# Patient Record
Sex: Female | Born: 1955
Health system: Southern US, Community
[De-identification: ages and names within clinical notes are randomized; demographics above are authoritative.]

## PROBLEM LIST (undated history)

## (undated) DIAGNOSIS — I10 Essential (primary) hypertension: Secondary | ICD-10-CM

## (undated) HISTORY — PX: KNEE ARTHROPLASTY: SHX992

## (undated) HISTORY — DX: Essential (primary) hypertension: I10

## (undated) HISTORY — PX: TUBAL LIGATION: SHX77

---

## 1997-12-02 ENCOUNTER — Other Ambulatory Visit: Admission: RE | Admit: 1997-12-02 | Discharge: 1997-12-02 | Payer: Self-pay | Admitting: Obstetrics and Gynecology

## 1999-08-24 ENCOUNTER — Other Ambulatory Visit: Admission: RE | Admit: 1999-08-24 | Discharge: 1999-08-24 | Payer: Self-pay | Admitting: Obstetrics and Gynecology

## 2001-03-27 ENCOUNTER — Other Ambulatory Visit: Admission: RE | Admit: 2001-03-27 | Discharge: 2001-03-27 | Payer: Self-pay | Admitting: Obstetrics and Gynecology

## 2001-09-06 ENCOUNTER — Encounter: Payer: Self-pay | Admitting: Internal Medicine

## 2001-09-06 ENCOUNTER — Encounter: Admission: RE | Admit: 2001-09-06 | Discharge: 2001-09-06 | Payer: Self-pay | Admitting: Internal Medicine

## 2001-10-17 ENCOUNTER — Encounter: Payer: Self-pay | Admitting: Internal Medicine

## 2001-10-17 ENCOUNTER — Ambulatory Visit (HOSPITAL_COMMUNITY): Admission: RE | Admit: 2001-10-17 | Discharge: 2001-10-17 | Payer: Self-pay | Admitting: Internal Medicine

## 2001-10-23 ENCOUNTER — Encounter: Payer: Self-pay | Admitting: Internal Medicine

## 2001-10-23 ENCOUNTER — Ambulatory Visit (HOSPITAL_COMMUNITY): Admission: RE | Admit: 2001-10-23 | Discharge: 2001-10-23 | Payer: Self-pay | Admitting: Internal Medicine

## 2002-12-05 ENCOUNTER — Ambulatory Visit (HOSPITAL_COMMUNITY): Admission: RE | Admit: 2002-12-05 | Discharge: 2002-12-05 | Payer: Self-pay | Admitting: Internal Medicine

## 2004-07-28 ENCOUNTER — Ambulatory Visit (HOSPITAL_COMMUNITY): Admission: RE | Admit: 2004-07-28 | Discharge: 2004-07-28 | Payer: Self-pay | Admitting: Obstetrics and Gynecology

## 2006-02-08 ENCOUNTER — Ambulatory Visit (HOSPITAL_COMMUNITY): Admission: RE | Admit: 2006-02-08 | Discharge: 2006-02-08 | Payer: Self-pay | Admitting: Gastroenterology

## 2006-04-21 ENCOUNTER — Ambulatory Visit (HOSPITAL_COMMUNITY): Admission: RE | Admit: 2006-04-21 | Discharge: 2006-04-21 | Payer: Self-pay | Admitting: Internal Medicine

## 2007-06-15 ENCOUNTER — Ambulatory Visit (HOSPITAL_COMMUNITY): Admission: RE | Admit: 2007-06-15 | Discharge: 2007-06-15 | Payer: Self-pay | Admitting: Internal Medicine

## 2009-01-20 ENCOUNTER — Ambulatory Visit (HOSPITAL_COMMUNITY): Admission: RE | Admit: 2009-01-20 | Discharge: 2009-01-20 | Payer: Self-pay | Admitting: Internal Medicine

## 2010-02-02 ENCOUNTER — Ambulatory Visit (HOSPITAL_COMMUNITY)
Admission: RE | Admit: 2010-02-02 | Discharge: 2010-02-02 | Payer: Self-pay | Source: Home / Self Care | Attending: Internal Medicine | Admitting: Internal Medicine

## 2010-02-21 ENCOUNTER — Encounter: Payer: Self-pay | Admitting: Internal Medicine

## 2011-01-28 ENCOUNTER — Other Ambulatory Visit (HOSPITAL_COMMUNITY): Payer: Self-pay | Admitting: Internal Medicine

## 2011-01-28 DIAGNOSIS — Z1231 Encounter for screening mammogram for malignant neoplasm of breast: Secondary | ICD-10-CM

## 2011-02-28 ENCOUNTER — Ambulatory Visit (HOSPITAL_COMMUNITY)
Admission: RE | Admit: 2011-02-28 | Discharge: 2011-02-28 | Disposition: A | Discharge status: Home / Self Care | Payer: BC Managed Care – PPO | Source: Ambulatory Visit | Attending: Internal Medicine | Admitting: Internal Medicine

## 2011-02-28 DIAGNOSIS — Z1231 Encounter for screening mammogram for malignant neoplasm of breast: Secondary | ICD-10-CM | POA: Insufficient documentation

## 2012-02-24 ENCOUNTER — Other Ambulatory Visit (HOSPITAL_COMMUNITY): Payer: Self-pay | Admitting: Internal Medicine

## 2012-02-24 DIAGNOSIS — Z1231 Encounter for screening mammogram for malignant neoplasm of breast: Secondary | ICD-10-CM

## 2012-02-29 ENCOUNTER — Ambulatory Visit (HOSPITAL_COMMUNITY)
Admission: RE | Admit: 2012-02-29 | Discharge: 2012-02-29 | Disposition: A | Discharge status: Home / Self Care | Payer: BC Managed Care – PPO | Source: Ambulatory Visit | Attending: Internal Medicine | Admitting: Internal Medicine

## 2012-02-29 DIAGNOSIS — Z1231 Encounter for screening mammogram for malignant neoplasm of breast: Secondary | ICD-10-CM

## 2012-05-16 ENCOUNTER — Other Ambulatory Visit: Payer: Self-pay | Admitting: Internal Medicine

## 2012-05-16 ENCOUNTER — Ambulatory Visit
Admission: RE | Admit: 2012-05-16 | Discharge: 2012-05-16 | Disposition: A | Discharge status: Home / Self Care | Payer: Managed Care, Other (non HMO) | Source: Ambulatory Visit | Attending: Internal Medicine | Admitting: Internal Medicine

## 2012-05-16 DIAGNOSIS — R05 Cough: Secondary | ICD-10-CM

## 2012-05-16 DIAGNOSIS — R053 Chronic cough: Secondary | ICD-10-CM

## 2012-06-26 ENCOUNTER — Other Ambulatory Visit (HOSPITAL_COMMUNITY): Payer: Self-pay | Admitting: Internal Medicine

## 2012-06-26 DIAGNOSIS — Z1231 Encounter for screening mammogram for malignant neoplasm of breast: Secondary | ICD-10-CM

## 2013-03-01 ENCOUNTER — Ambulatory Visit (HOSPITAL_COMMUNITY)
Admission: RE | Admit: 2013-03-01 | Discharge: 2013-03-01 | Disposition: A | Discharge status: Home / Self Care | Payer: BC Managed Care – PPO | Source: Ambulatory Visit | Attending: Internal Medicine | Admitting: Internal Medicine

## 2013-03-01 DIAGNOSIS — Z1231 Encounter for screening mammogram for malignant neoplasm of breast: Secondary | ICD-10-CM | POA: Insufficient documentation

## 2014-02-05 ENCOUNTER — Other Ambulatory Visit (HOSPITAL_COMMUNITY): Payer: Self-pay | Admitting: Internal Medicine

## 2014-02-05 DIAGNOSIS — Z1231 Encounter for screening mammogram for malignant neoplasm of breast: Secondary | ICD-10-CM

## 2014-03-03 ENCOUNTER — Ambulatory Visit (HOSPITAL_COMMUNITY)
Admission: RE | Admit: 2014-03-03 | Discharge: 2014-03-03 | Disposition: A | Discharge status: Home / Self Care | Payer: BC Managed Care – PPO | Source: Ambulatory Visit | Attending: Internal Medicine | Admitting: Internal Medicine

## 2014-03-03 DIAGNOSIS — Z1231 Encounter for screening mammogram for malignant neoplasm of breast: Secondary | ICD-10-CM | POA: Diagnosis present

## 2015-01-27 ENCOUNTER — Other Ambulatory Visit: Payer: Self-pay

## 2015-01-27 DIAGNOSIS — Z1231 Encounter for screening mammogram for malignant neoplasm of breast: Secondary | ICD-10-CM

## 2015-03-05 ENCOUNTER — Ambulatory Visit
Admission: RE | Admit: 2015-03-05 | Discharge: 2015-03-05 | Disposition: A | Discharge status: Home / Self Care | Payer: BC Managed Care – PPO | Source: Ambulatory Visit

## 2015-03-05 DIAGNOSIS — Z1231 Encounter for screening mammogram for malignant neoplasm of breast: Secondary | ICD-10-CM

## 2016-04-12 ENCOUNTER — Other Ambulatory Visit: Payer: Self-pay | Admitting: Internal Medicine

## 2016-04-12 DIAGNOSIS — Z1231 Encounter for screening mammogram for malignant neoplasm of breast: Secondary | ICD-10-CM

## 2016-04-29 ENCOUNTER — Ambulatory Visit: Payer: BC Managed Care – PPO

## 2016-05-02 ENCOUNTER — Ambulatory Visit
Admission: RE | Admit: 2016-05-02 | Discharge: 2016-05-02 | Disposition: A | Discharge status: Home / Self Care | Payer: BC Managed Care – PPO | Source: Ambulatory Visit | Attending: Internal Medicine | Admitting: Internal Medicine

## 2016-05-02 DIAGNOSIS — Z1231 Encounter for screening mammogram for malignant neoplasm of breast: Secondary | ICD-10-CM

## 2016-11-15 ENCOUNTER — Other Ambulatory Visit: Payer: Self-pay | Admitting: Internal Medicine

## 2016-11-15 DIAGNOSIS — E2839 Other primary ovarian failure: Secondary | ICD-10-CM

## 2017-04-11 ENCOUNTER — Other Ambulatory Visit: Payer: Self-pay | Admitting: Internal Medicine

## 2017-04-11 DIAGNOSIS — Z1231 Encounter for screening mammogram for malignant neoplasm of breast: Secondary | ICD-10-CM

## 2017-05-09 ENCOUNTER — Ambulatory Visit
Admission: RE | Admit: 2017-05-09 | Discharge: 2017-05-09 | Disposition: A | Discharge status: Home / Self Care | Payer: BC Managed Care – PPO | Source: Ambulatory Visit | Attending: Internal Medicine | Admitting: Internal Medicine

## 2017-05-09 DIAGNOSIS — Z1231 Encounter for screening mammogram for malignant neoplasm of breast: Secondary | ICD-10-CM

## 2017-05-09 DIAGNOSIS — E2839 Other primary ovarian failure: Secondary | ICD-10-CM

## 2018-04-11 ENCOUNTER — Other Ambulatory Visit: Payer: Self-pay | Admitting: Family Medicine

## 2018-04-11 DIAGNOSIS — Z1231 Encounter for screening mammogram for malignant neoplasm of breast: Secondary | ICD-10-CM

## 2018-05-15 ENCOUNTER — Ambulatory Visit: Payer: BC Managed Care – PPO

## 2018-07-03 ENCOUNTER — Other Ambulatory Visit: Payer: Self-pay | Admitting: Family Medicine

## 2018-07-03 ENCOUNTER — Other Ambulatory Visit: Payer: Self-pay

## 2018-07-03 ENCOUNTER — Ambulatory Visit
Admission: RE | Admit: 2018-07-03 | Discharge: 2018-07-03 | Disposition: A | Discharge status: Home / Self Care | Payer: BC Managed Care – PPO | Source: Ambulatory Visit | Attending: Family Medicine | Admitting: Family Medicine

## 2018-07-03 DIAGNOSIS — Z1231 Encounter for screening mammogram for malignant neoplasm of breast: Secondary | ICD-10-CM

## 2018-08-08 ENCOUNTER — Other Ambulatory Visit: Payer: Self-pay

## 2018-08-08 ENCOUNTER — Ambulatory Visit: Payer: BC Managed Care – PPO | Admitting: Internal Medicine

## 2018-08-08 ENCOUNTER — Encounter: Payer: Self-pay | Admitting: Internal Medicine

## 2018-08-08 ENCOUNTER — Ambulatory Visit (INDEPENDENT_AMBULATORY_CARE_PROVIDER_SITE_OTHER): Payer: BC Managed Care – PPO

## 2018-08-08 DIAGNOSIS — R0789 Other chest pain: Secondary | ICD-10-CM

## 2018-08-08 NOTE — Progress Notes (Signed)
Amber Farmer, female    DOB: Jan 28, 1956,  MRN: [id number]   Brief patient profile:  63 yobf never smoker with onset of recurrent cp x in her 20's while living in [place] with full term  IUP's with last one age 63-64 tol fine (says did not notice when pregnant)  and  eval by [place] around 2010 neg but more noticeable since 2018 so referred to pulmonary clinic 08/08/2018 by Dr   Moreen Fowler      History of Present Illness  08/08/2018  Pulmonary/ 1st office eval/[facility]  Chief Complaint  Patient presents with  . Pulmonary Consult    Referred by Dr Antony Contras. Pt c/o CP x 40 yrs.   Dyspnea:  Walks daily, some hill/steps no trouble with house work never brings on chest pain Cough: none  Sleep: fine now sleeping  on back and does better and never wakes her now, never has   SABA use: none   Present daily, located just to the Left of lower sternum  last from one second to sev minutes never an hour, no relation to meals, belching  always in sitting position  - assoc with some sensation of abd bloating and has changed diet to more salads in recent years.   No obvious other patterns  day to day or daytime variability or assoc excess/ purulent sputum or mucus plugs or hemoptysis or  chest tightness, subjective wheeze or overt sinus or hb symptoms.   Sleeping  without nocturnal  or early am exacerbation  of respiratory  c/o's or need for noct saba. Also denies any obvious fluctuation of symptoms with weather or environmental changes or other aggravating or alleviating factors except as outlined above   No unusual exposure hx or h/o childhood pna/ asthma or knowledge of premature birth.  Current Allergies, Complete Past Medical History, Past Surgical History, Family History, and Social History were reviewed in [employer] record.  ROS  The following are not active complaints unless bolded Hoarseness, sore throat, dysphagia, dental problems, itching, sneezing,  nasal  congestion or discharge of excess mucus or purulent secretions, ear ache,   fever, chills, sweats, unintended wt loss or wt gain, classically pleuritic or exertional cp,  orthopnea pnd or arm/hand swelling  or leg swelling, presyncope, palpitations, abdominal pain, anorexia, nausea, vomiting, diarrhea  or change in bowel habits or change in bladder habits, change in stools or change in urine, dysuria, hematuria,  rash, arthralgias, visual complaints, headache, numbness, weakness or ataxia or problems with walking or coordination,  change in mood or  memory.             No past medical history on file.  Outpatient Medications Prior to Visit  Medication Sig Dispense Refill  . aspirin EC 81 MG tablet Take 81 mg by mouth daily.    . Calcium 200 MG TABS Take by mouth.    . Multiple Vitamin (MULTIVITAMIN) capsule Take 1 capsule by mouth daily.    . valsartan-hydrochlorothiazide (DIOVAN-HCT) 160-12.5 MG tablet Take 1 tablet by mouth daily.    Marland Kitchen VITAMIN D PO Take 1 tablet by mouth daily.        Objective:     BP 136/80 (BP Location: Left Arm, Cuff Size: Normal)   Pulse 78   Temp 98.4 F (36.9 C) (Oral)   Ht 5\' 5"  (1.651 m)   Wt 171 lb 12.8 oz (77.9 kg)   SpO2 98% Comment: on RA  BMI 28.59 kg/m   SpO2:  98 %(on RA)   amb bf nad  HEENT: nl dentition, turbinates bilaterally, and oropharynx. Nl external ear canals without cough reflex   NECK :  without JVD/Nodes/TM/ nl carotid upstrokes bilaterally   LUNGS: no acc muscle use,  Nl contour chest which is clear to A and P bilaterally without cough on insp or exp maneuvers   CV:  RRR  no s3 or murmur or increase in P2, and no edema   ABD:  soft and nontender with nl inspiratory excursion in the supine position. No bruits or organomegaly appreciated, bowel sounds nl  MS:  Nl gait/ ext warm without deformities, calf tenderness, cyanosis or clubbing No obvious joint restrictions   SKIN: warm and dry without lesions    NEURO:  alert,  approp, nl sensorium with  no motor or cerebellar deficits apparent.   CXR PA and Lateral:   08/08/2018 :    I personally reviewed images and agree with radiology impression as follows:    No active cardiopulmonary disease.     Assessment   No problem-specific Assessment & Plan notes found for this encounter.     Sandrea HughsMichael Wert, MD 08/08/2018

## 2018-08-08 NOTE — Patient Instructions (Addendum)
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 Frequently these patients have had multiple negative GI workups and CT scans.  Treatment consists of avoiding foods that cause gas (especially boiled eggs, mexcican food but especially  beans and undercooked vegetables like  spinach and some salads)  and citrucel 1 heaping tsp twice daily with a large glass of water.  Pain should improve w/in 2 weeks and if not then consider further GI work up.     Please remember to go to the  x-ray department  for your tests - we will call you with the results when they are available     Call if not better after at least two weeks of the above for referral to GI doctor

## 2018-08-09 ENCOUNTER — Telehealth: Payer: Self-pay | Admitting: Internal Medicine

## 2018-08-09 ENCOUNTER — Encounter: Payer: Self-pay | Admitting: Internal Medicine

## 2018-08-09 NOTE — Telephone Encounter (Signed)
Notes recorded by Tanda Rockers, MD on 08/08/2018 at 4:44 PM EDT  Call pt: Reviewed cxr and no acute change so no change in recommendations made at ov  ----------------------------------- Spoke with pt. She is aware of results. Nothing further was needed.

## 2018-08-09 NOTE — Assessment & Plan Note (Signed)
Onset in her 20's, strictly sitting, never supine or with exertion  - [place] w/u neg around 2010  - IBS rx rec  08/08/2018   Classic  Atypical chest pain pattern suggests ibs:  Stereotypical,   with a very limited distribution of pain locations, daytime, not usually exacerbated by exercise  or coughing, worse in sitting position, frequently associated with generalized abd bloating, not as likely to be present supine due to the dome effect of the diaphragm which  is  canceled in that position. Frequently these patients have had multiple negative GI workups and CT scans.  Treatment consists of avoiding foods that cause gas (especially boiled eggs, mexcican food but especially  beans and undercooked vegetables like  spinach and some salads)  and citrucel 1 heaping tsp twice daily with a large glass of water.  Pain should improve w/in 2 weeks and if not then consider further GI work up.      Discussed in detail all the  indications, usual  risks and alternatives  relative to the benefits with patient who agrees to proceed with w/u as outlined.      Total time devoted to counseling  > 50 % of initial 60 min office visit:  review case with pt/ discussion of options/alternatives/ personally creating written customized instructions  in presence of pt  then going over those specific  Instructions directly with the pt including how to use all of the meds but in particular covering each new medication in detail and the difference between the maintenance= "automatic" meds and the prns using an action plan format for the latter (If this problem/symptom => do that organization reading Left to right).  Please see AVS from this visit for a full list of these instructions which I personally wrote for this pt and  are unique to this visit.

## 2019-02-13 ENCOUNTER — Ambulatory Visit: Payer: BC Managed Care – PPO | Attending: Internal Medicine

## 2019-02-13 ENCOUNTER — Telehealth: Payer: Self-pay | Admitting: Internal Medicine

## 2019-02-13 DIAGNOSIS — Z20822 Contact with and (suspected) exposure to covid-19: Secondary | ICD-10-CM

## 2019-02-13 NOTE — Telephone Encounter (Signed)
  Nyoka Cowden, MD  08/08/2018 4:44 PM EDT    Call pt: Reviewed cxr and no acute change so no change in recommendations made at [facility]     Advised pt of results. Pt understood and nothing further is needed.

## 2019-02-14 LAB — NOVEL CORONAVIRUS, NAA: SARS-CoV-2, NAA: NOT DETECTED

## 2019-02-15 ENCOUNTER — Ambulatory Visit: Payer: BC Managed Care – PPO | Attending: Internal Medicine

## 2019-02-15 DIAGNOSIS — Z20822 Contact with and (suspected) exposure to covid-19: Secondary | ICD-10-CM

## 2019-02-16 LAB — NOVEL CORONAVIRUS, NAA: SARS-CoV-2, NAA: NOT DETECTED

## 2019-04-06 ENCOUNTER — Ambulatory Visit: Payer: BC Managed Care – PPO | Attending: Internal Medicine

## 2019-04-06 DIAGNOSIS — Z23 Encounter for immunization: Secondary | ICD-10-CM

## 2019-04-06 NOTE — Progress Notes (Signed)
   Covid-19 Vaccination Clinic  Name:  Tyshana Nishida    MRN: [id number] DOB: 08-Aug-1955  04/06/2019  Ms. Longanecker was observed post Covid-19 immunization for 15 minutes without incident. She was provided with Vaccine Information Sheet and instruction to access the V-Safe system.   Ms. Heinsohn was instructed to call 911 with any severe reactions post vaccine: Marland Kitchen Difficulty breathing  . Swelling of face and throat  . A fast heartbeat  . A bad rash all over body  . Dizziness and weakness   Immunizations Administered    Name Date Dose VIS Date Route   Pfizer COVID-19 Vaccine 04/06/2019  9:48 AM 0.3 mL 01/11/2019 Intramuscular   Manufacturer: [employer], [employer]   Lot: [id number]   NDC: [address]-7

## 2019-04-27 ENCOUNTER — Ambulatory Visit: Payer: BC Managed Care – PPO | Attending: Internal Medicine

## 2019-04-27 DIAGNOSIS — Z23 Encounter for immunization: Secondary | ICD-10-CM

## 2019-04-27 NOTE — Progress Notes (Signed)
   Covid-19 Vaccination Clinic  Name:  Amber Farmer    MRN: [id number] DOB: [birth year]  04/27/2019  [name] was observed post Covid-19 immunization for 15 minutes without incident. She was provided with Vaccine Information Sheet and instruction to access the V-Safe system.   [name] was instructed to call 911 with any severe reactions post vaccine: Marland Kitchen Difficulty breathing  . Swelling of face and throat  . A fast heartbeat  . A bad rash all over body  . Dizziness and weakness   Immunizations Administered    Name Date Dose VIS Date Route   Pfizer COVID-19 Vaccine 04/27/2019 11:03 AM 0.3 mL 01/11/2019 Intramuscular   Manufacturer: [employer], [employer]   Lot: [id number]   NDC: [address]-3

## 2020-02-04 ENCOUNTER — Other Ambulatory Visit: Payer: Self-pay

## 2020-02-04 ENCOUNTER — Ambulatory Visit: Payer: Self-pay | Attending: Internal Medicine

## 2020-02-04 DIAGNOSIS — Z23 Encounter for immunization: Secondary | ICD-10-CM

## 2020-02-04 NOTE — Progress Notes (Signed)
   Covid-19 Vaccination Clinic  Name:  Amber Farmer    MRN: [id number] DOB: 1955/09/04  02/04/2020  Amber Farmer was observed post Covid-19 immunization for 15 minutes without incident. She was provided with Vaccine Information Sheet and instruction to access the V-Safe system.   Amber Farmer was instructed to call 911 with any severe reactions post vaccine: Marland Kitchen Difficulty breathing  . Swelling of face and throat  . A fast heartbeat  . A bad rash all over body  . Dizziness and weakness   Immunizations Administered    Name Date Dose VIS Date Route   Pfizer COVID-19 Vaccine 02/04/2020  3:30 PM 0.3 mL 11/20/2019 Intramuscular   Manufacturer: [employer], [employer]   Lot: [id number]   NDC: [address]-1

## 2020-05-20 DIAGNOSIS — H35033 Hypertensive retinopathy, bilateral: Secondary | ICD-10-CM | POA: Diagnosis not present

## 2020-05-26 DIAGNOSIS — I10 Essential (primary) hypertension: Secondary | ICD-10-CM | POA: Diagnosis not present

## 2020-05-26 DIAGNOSIS — E7800 Pure hypercholesterolemia, unspecified: Secondary | ICD-10-CM | POA: Diagnosis not present

## 2020-05-26 DIAGNOSIS — Z23 Encounter for immunization: Secondary | ICD-10-CM | POA: Diagnosis not present

## 2020-05-26 DIAGNOSIS — R7303 Prediabetes: Secondary | ICD-10-CM | POA: Diagnosis not present

## 2020-05-26 DIAGNOSIS — M940 Chondrocostal junction syndrome [Tietze]: Secondary | ICD-10-CM | POA: Diagnosis not present

## 2020-05-26 DIAGNOSIS — R7309 Other abnormal glucose: Secondary | ICD-10-CM | POA: Diagnosis not present

## 2020-08-20 DIAGNOSIS — Z20822 Contact with and (suspected) exposure to covid-19: Secondary | ICD-10-CM | POA: Diagnosis not present

## 2020-08-20 DIAGNOSIS — J069 Acute upper respiratory infection, unspecified: Secondary | ICD-10-CM | POA: Diagnosis not present

## 2020-08-20 DIAGNOSIS — U071 COVID-19: Secondary | ICD-10-CM | POA: Diagnosis not present

## 2020-08-25 DIAGNOSIS — Z20822 Contact with and (suspected) exposure to covid-19: Secondary | ICD-10-CM | POA: Diagnosis not present

## 2020-08-28 DIAGNOSIS — Z20822 Contact with and (suspected) exposure to covid-19: Secondary | ICD-10-CM | POA: Diagnosis not present

## 2020-08-28 DIAGNOSIS — Z03818 Encounter for observation for suspected exposure to other biological agents ruled out: Secondary | ICD-10-CM | POA: Diagnosis not present

## 2020-09-22 DIAGNOSIS — Z1239 Encounter for other screening for malignant neoplasm of breast: Secondary | ICD-10-CM | POA: Diagnosis not present

## 2020-09-22 DIAGNOSIS — Z1211 Encounter for screening for malignant neoplasm of colon: Secondary | ICD-10-CM | POA: Diagnosis not present

## 2020-09-22 DIAGNOSIS — Z1231 Encounter for screening mammogram for malignant neoplasm of breast: Secondary | ICD-10-CM | POA: Diagnosis not present

## 2020-09-22 DIAGNOSIS — Z01419 Encounter for gynecological examination (general) (routine) without abnormal findings: Secondary | ICD-10-CM | POA: Diagnosis not present

## 2020-09-22 DIAGNOSIS — M25511 Pain in right shoulder: Secondary | ICD-10-CM | POA: Diagnosis not present

## 2020-10-13 DIAGNOSIS — M25511 Pain in right shoulder: Secondary | ICD-10-CM | POA: Diagnosis not present

## 2020-10-14 DIAGNOSIS — M25511 Pain in right shoulder: Secondary | ICD-10-CM | POA: Diagnosis not present

## 2020-10-14 DIAGNOSIS — M25512 Pain in left shoulder: Secondary | ICD-10-CM | POA: Diagnosis not present

## 2020-10-14 DIAGNOSIS — M19011 Primary osteoarthritis, right shoulder: Secondary | ICD-10-CM | POA: Diagnosis not present

## 2020-11-23 DIAGNOSIS — M25511 Pain in right shoulder: Secondary | ICD-10-CM | POA: Diagnosis not present

## 2020-11-23 DIAGNOSIS — L600 Ingrowing nail: Secondary | ICD-10-CM | POA: Diagnosis not present

## 2020-11-23 DIAGNOSIS — M940 Chondrocostal junction syndrome [Tietze]: Secondary | ICD-10-CM | POA: Diagnosis not present

## 2020-11-23 DIAGNOSIS — Z1159 Encounter for screening for other viral diseases: Secondary | ICD-10-CM | POA: Diagnosis not present

## 2020-11-23 DIAGNOSIS — Z0000 Encounter for general adult medical examination without abnormal findings: Secondary | ICD-10-CM | POA: Diagnosis not present

## 2020-11-23 DIAGNOSIS — E7800 Pure hypercholesterolemia, unspecified: Secondary | ICD-10-CM | POA: Diagnosis not present

## 2020-11-23 DIAGNOSIS — I10 Essential (primary) hypertension: Secondary | ICD-10-CM | POA: Diagnosis not present

## 2020-11-23 DIAGNOSIS — E2839 Other primary ovarian failure: Secondary | ICD-10-CM | POA: Diagnosis not present

## 2020-11-23 DIAGNOSIS — R7303 Prediabetes: Secondary | ICD-10-CM | POA: Diagnosis not present

## 2020-11-23 DIAGNOSIS — M19011 Primary osteoarthritis, right shoulder: Secondary | ICD-10-CM | POA: Diagnosis not present

## 2020-12-03 ENCOUNTER — Other Ambulatory Visit: Payer: Self-pay

## 2020-12-03 ENCOUNTER — Ambulatory Visit: Payer: Medicare PPO | Admitting: Sports Medicine

## 2020-12-03 DIAGNOSIS — M79675 Pain in left toe(s): Secondary | ICD-10-CM | POA: Diagnosis not present

## 2020-12-03 DIAGNOSIS — M79674 Pain in right toe(s): Secondary | ICD-10-CM | POA: Diagnosis not present

## 2020-12-03 DIAGNOSIS — M79672 Pain in left foot: Secondary | ICD-10-CM

## 2020-12-03 DIAGNOSIS — M79676 Pain in unspecified toe(s): Secondary | ICD-10-CM | POA: Diagnosis not present

## 2020-12-03 MED ORDER — NEOMYCIN-POLYMYXIN-HC 3.5-10000-1 OT SOLN: OTIC | 0 refills | Status: AC

## 2020-12-03 NOTE — Progress Notes (Signed)
Subjective: Amber Farmer is a 65 y.o. female patient who presents to office for evaluation of 1.  Nails that tend to grow into the skin on both big toes states that currently there is no soreness only occasional soreness most recently on the left great toenail.  Patient also reports that she tends to have to dig out the nails and has been doing this for very long time but has been afraid since COVID to go back for pedicures.  Patient also reports that she has some discoloration at the bottom of the left third toe thinks that is from her putting on a toe ring and leaving it on too long.  Patient also states for the last 6 months to a year has had an annoying pain when she is trying to sleep or lay down at the back of her left heel states that she cannot put pressure directly to the back of the heel because of the ongoing discomfort states that it does not really hurt but is annoying and she has to change position in bed in order to get relief.  Patient denies any other pedal complaints at this time.  Patient Active Problem List   Diagnosis Date Noted   Atypical chest pain 08/08/2018    Current Outpatient Medications on File Prior to Visit  Medication Sig Dispense Refill   aspirin EC 81 MG tablet Take 81 mg by mouth daily.     Calcium 200 MG TABS Take by mouth.     Multiple Vitamin (MULTIVITAMIN) capsule Take 1 capsule by mouth daily.     valsartan-hydrochlorothiazide (DIOVAN-HCT) 160-12.5 MG tablet Take 1 tablet by mouth daily.     VITAMIN D PO Take 1 tablet by mouth daily.     No current facility-administered medications on file prior to visit.    Allergies  Allergen Reactions   Codeine Camsylate [Codeine] Itching    Objective:  General: Alert and oriented x3 in no acute distress  Dermatology: No open lesions bilateral lower extremities, no webspace macerations, no ecchymosis bilateral, all nails x 10 are well manicured there is minimal incurvation noted bilateral hallux there is  some significant dry skin at the medial nail fold left hallux greater than right.  There is mild discoloration likely consistent with scar at the plantar aspect of the left third toe with no signs of infection.  Vascular: Dorsalis Pedis and Posterior Tibial pedal pulses palpable, Capillary Fill Time 3 seconds,(+) pedal hair growth bilateral, no edema bilateral lower extremities, Temperature gradient within normal limits.  Neurology: Michaell Cowing sensation intact via light touch bilateral.  Musculoskeletal: Mild tenderness with palpation at Achilles insertion on the posterior heel on the left there is no palpable dell or gap Achilles feels intact and there is only some discomfort with palpation to the Achilles insertion directly central on the left heel.  There is limited ankle joint range of motion on the left.  Assessment and Plan: Problem List Items Addressed This Visit   None Visit Diagnoses     Pain around toenail    -  Primary   Toe pain, bilateral       Pain of left heel       Small annoying discomfort        -Complete examination performed -Discussed treatment options for chronic nail problem with pain around toenails with incurvation at this time there is no acute signs of an ingrown recommend conservative care with patient going for pedicures and using Corticosporin as directed if there  is any pain to the skin around the toes or the nail folds or at areas of ingrowing advised patient if this fails to provide complete relief in the future may benefit from a nail procedure -Discussed treatment options for what I think is a scar from her wearing a toe ring at the plantar aspect of the left third toe.  Advised patient to monitor since there is no open lesions to the area and advised patient to use over-the-counter scar cream or cocoa butter to the area after a bath or shower -Discussed treatment options for annoying pain at the back of the left heel advised patient to offload the heel when  sleeping with a pillow underneath the calf to prevent rubbing or direct pressure to the heel, advised patient gentle stretching as directed throughout her day as well as before and after she goes for walks, advised patient to avoid walks that are very healing that could cause the tendon to tighten up and over pool, advised patient to try over-the-counter topical pain rub or patch and may also try soaking and ice if pain worsens.  Advised patient if pain worsens may return to office for reevaluation and for x-rays -Return in 8 weeks for toe check/nail check or sooner if condition worsens.  Advised patient if conservative option is doing good for her history of pain around the toenails and we will continue with this versus considering a ingrown nail procedure however at this time patient appears to want to continue with conservative care for this issue  Asencion Islam, DPM

## 2021-02-04 ENCOUNTER — Ambulatory Visit: Payer: Medicare PPO | Admitting: Sports Medicine

## 2021-04-20 DIAGNOSIS — Z86010 Personal history of colonic polyps: Secondary | ICD-10-CM | POA: Diagnosis not present

## 2021-04-20 DIAGNOSIS — I10 Essential (primary) hypertension: Secondary | ICD-10-CM | POA: Diagnosis not present

## 2021-04-20 DIAGNOSIS — Z1211 Encounter for screening for malignant neoplasm of colon: Secondary | ICD-10-CM | POA: Diagnosis not present

## 2021-04-20 DIAGNOSIS — K5730 Diverticulosis of large intestine without perforation or abscess without bleeding: Secondary | ICD-10-CM | POA: Diagnosis not present

## 2021-05-27 DIAGNOSIS — F458 Other somatoform disorders: Secondary | ICD-10-CM | POA: Diagnosis not present

## 2021-05-27 DIAGNOSIS — I10 Essential (primary) hypertension: Secondary | ICD-10-CM | POA: Diagnosis not present

## 2021-05-27 DIAGNOSIS — R519 Headache, unspecified: Secondary | ICD-10-CM | POA: Diagnosis not present

## 2021-05-27 DIAGNOSIS — R61 Generalized hyperhidrosis: Secondary | ICD-10-CM | POA: Diagnosis not present

## 2021-05-27 DIAGNOSIS — R0789 Other chest pain: Secondary | ICD-10-CM | POA: Diagnosis not present

## 2021-05-27 IMAGING — MG DIGITAL SCREENING BILATERAL MAMMOGRAM WITH CAD
4 series · 4 of 4 positions shown · non-contrast
Comparison: Previous exam(s).

CLINICAL DATA: Screening.

EXAM:
DIGITAL SCREENING BILATERAL MAMMOGRAM WITH CAD

[L CC]
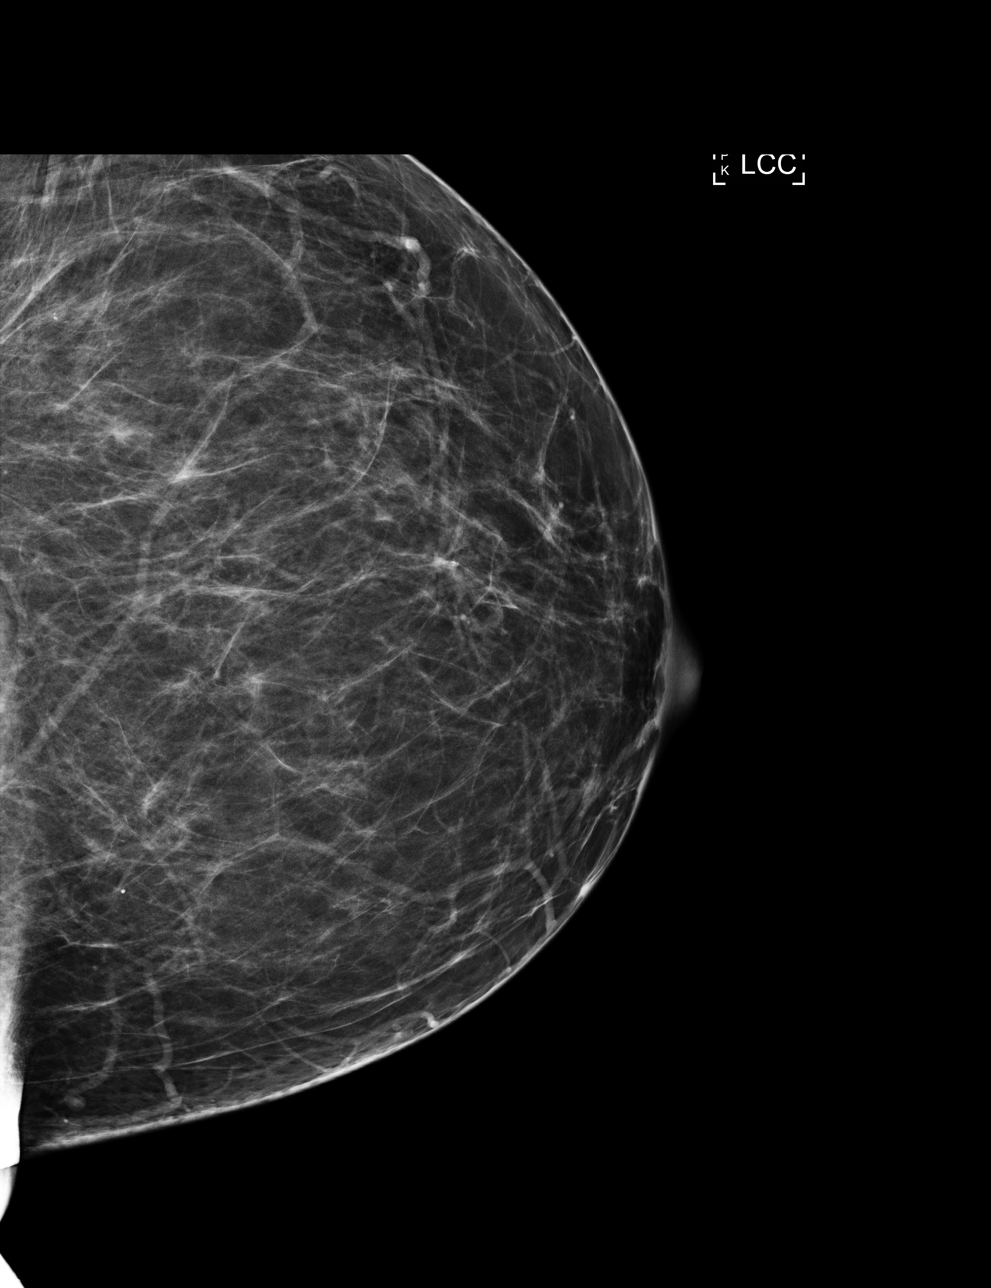

[L MLO]
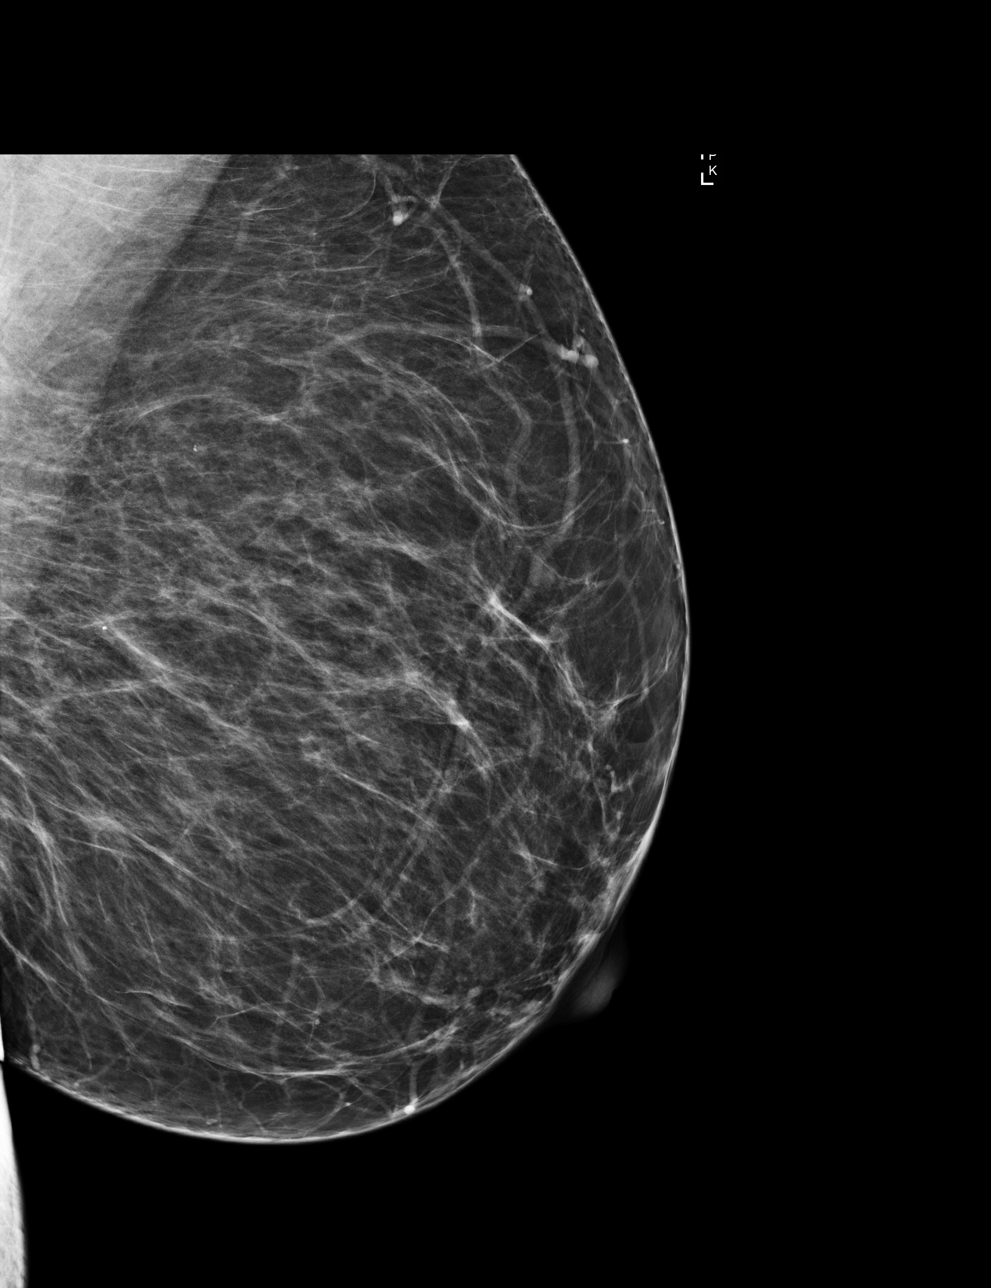

[R MLO]
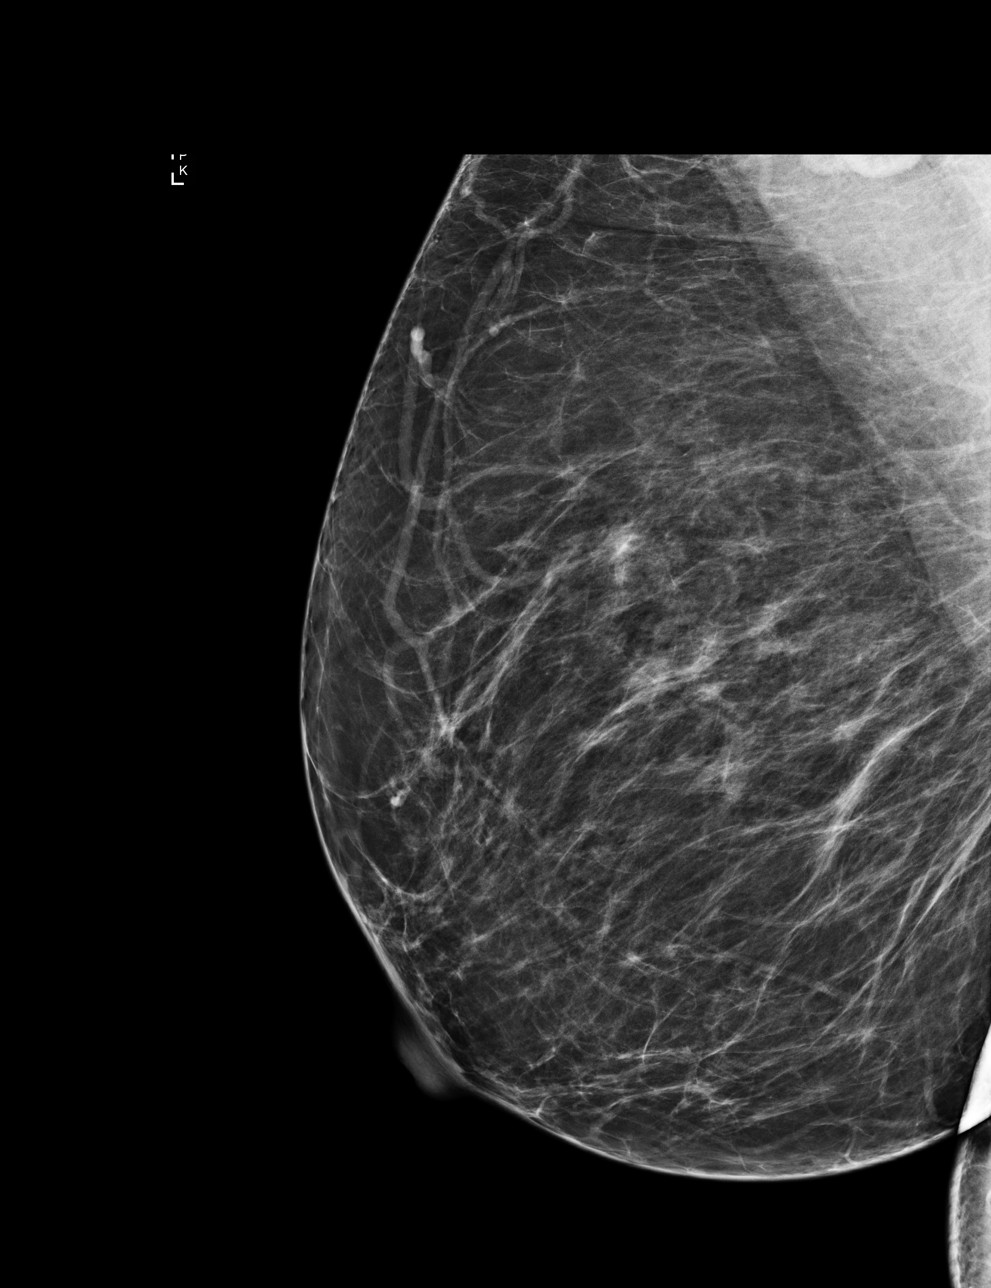

[R CC]
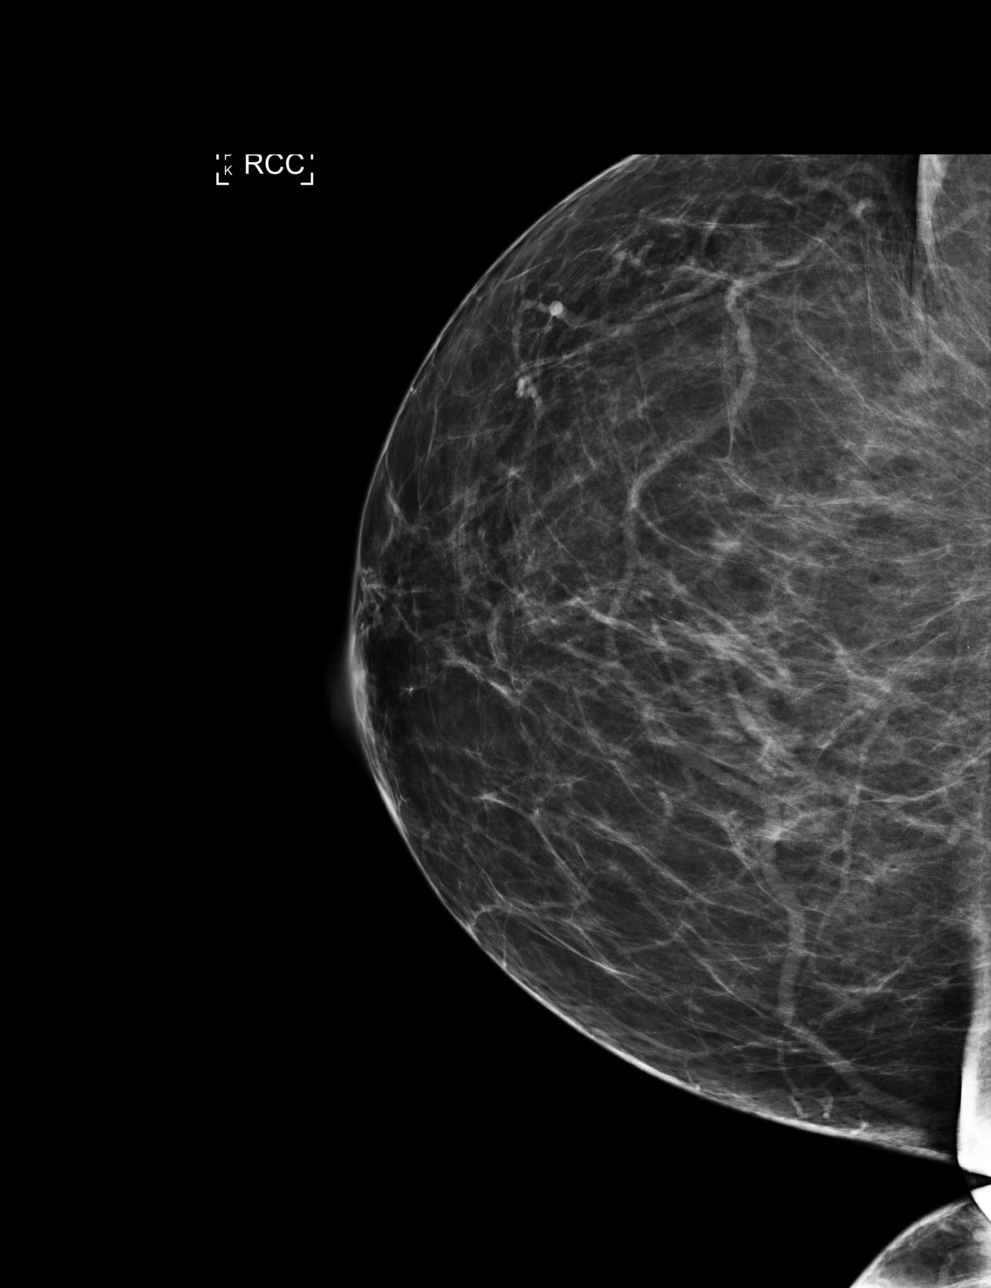

[4 of 4 positions shown; findings below may reference images not displayed]

ACR Breast Density Category b: There are scattered areas of
fibroglandular density.
FINDINGS: There are no findings suspicious for malignancy. Images were
processed with CAD.
IMPRESSION: No mammographic evidence of malignancy. A result letter of this
screening mammogram will be mailed directly to the patient.

RECOMMENDATION:
Screening mammogram in one year. (Code:AS-G-LCT)

BI-RADS CATEGORY  1: Negative.

## 2021-07-02 IMAGING — DX CHEST - 2 VIEW
2 series · 2 of 2 positions shown · non-contrast
Comparison: 05/16/2012

CLINICAL DATA: Atypical chest pain.

EXAM:
CHEST - 2 VIEW

[chest pa]
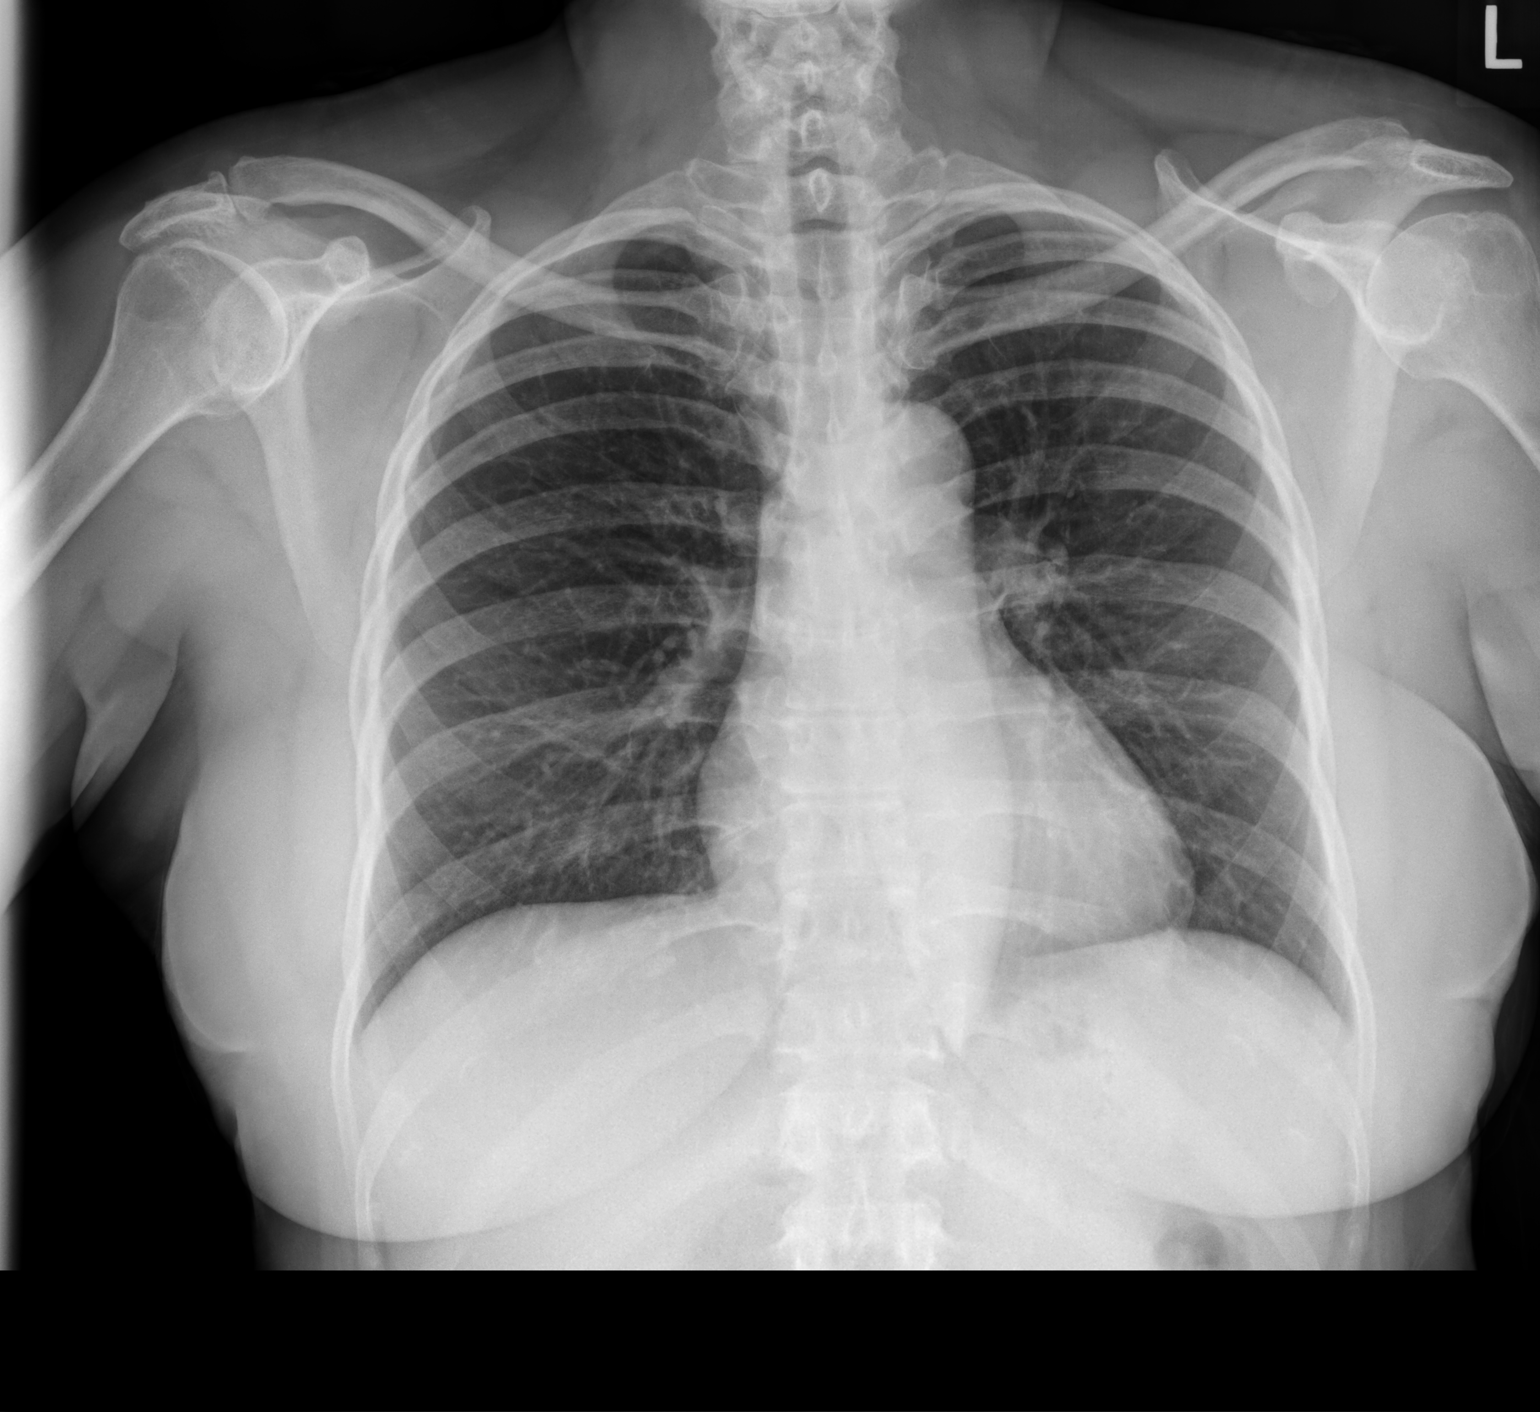

[chest lat]
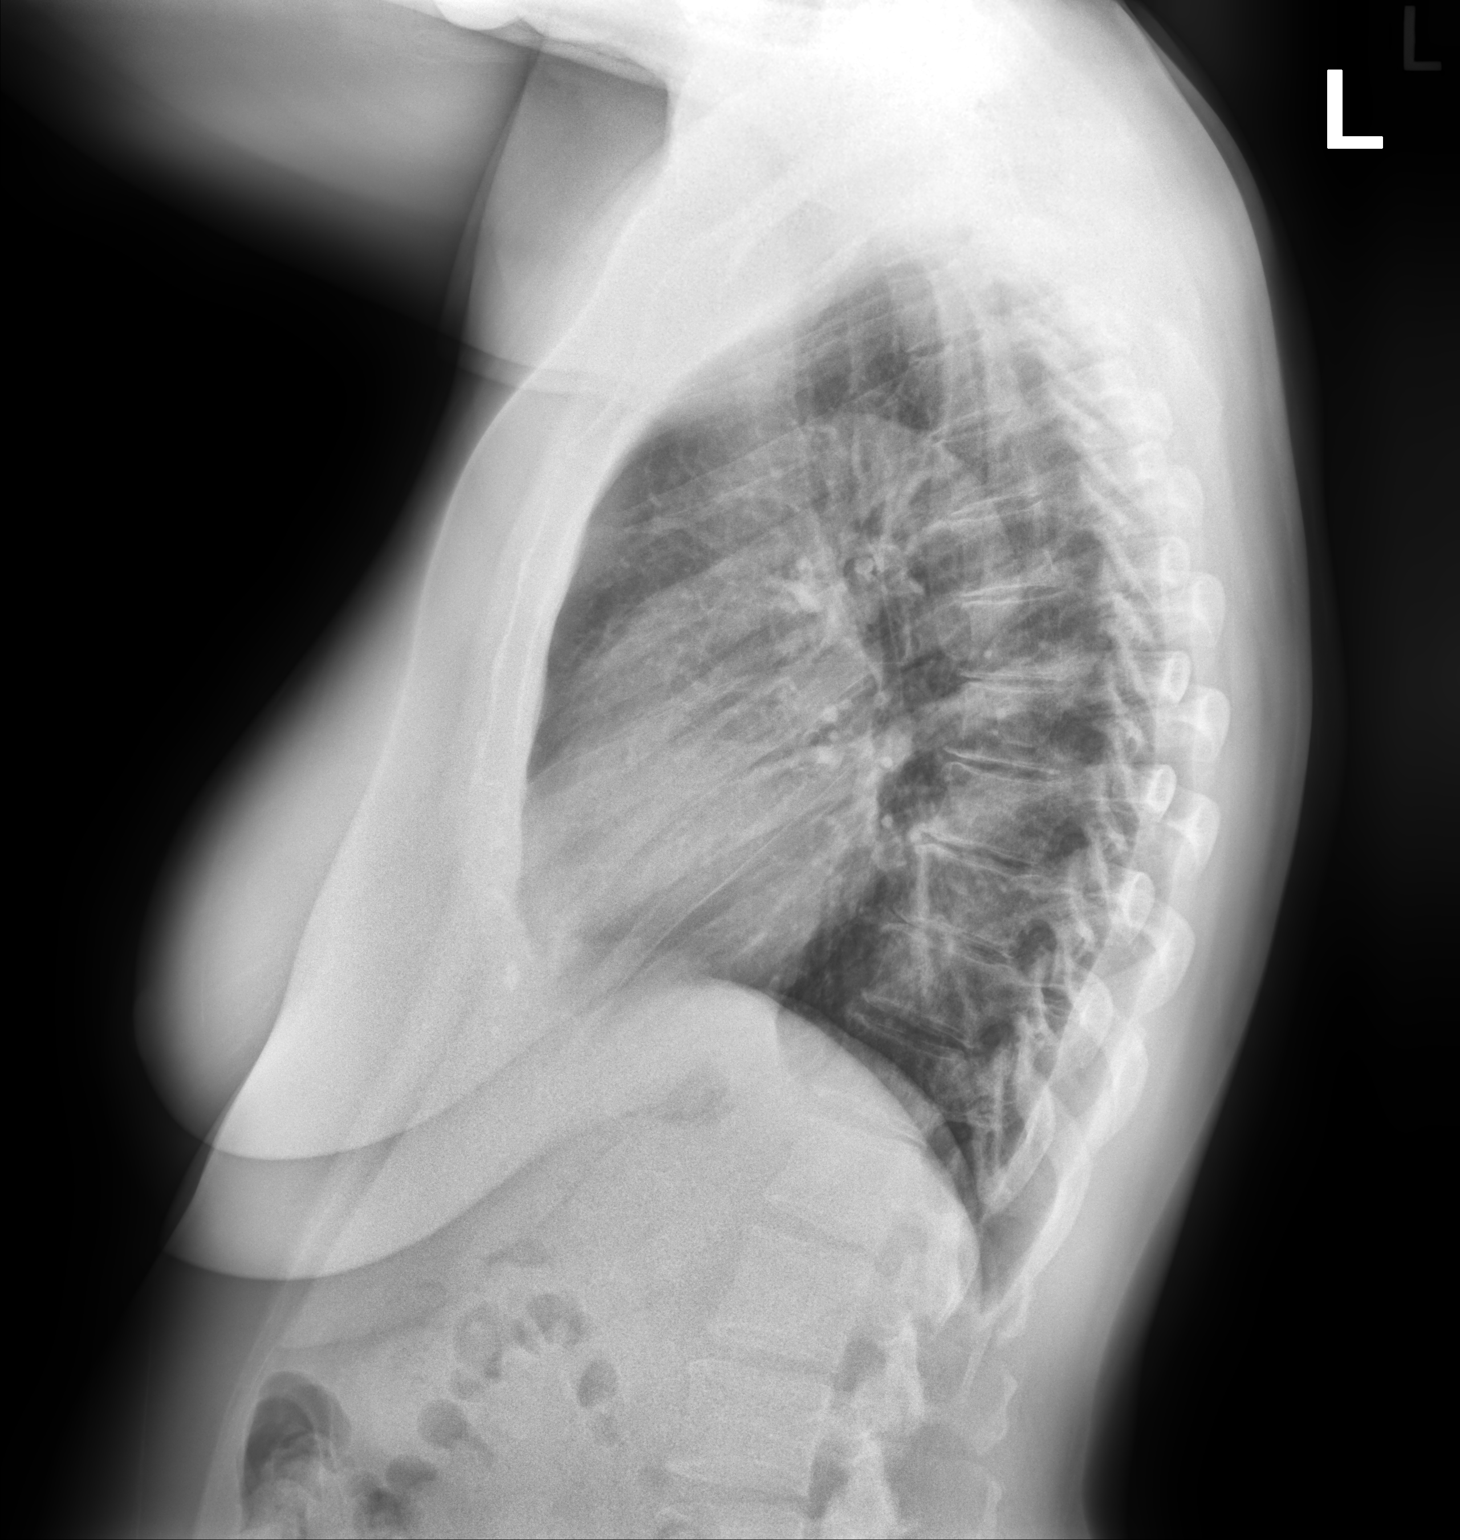

[2 of 2 positions shown; findings below may reference images not displayed]

FINDINGS: Cardiac silhouette is normal in size. Normal mediastinal and hilar
contours.

Clear lungs.  No pleural effusion or pneumothorax.

Skeletal structures are intact.
IMPRESSION: No active cardiopulmonary disease.

## 2021-07-08 ENCOUNTER — Ambulatory Visit: Payer: Self-pay | Admitting: Student

## 2021-07-14 DIAGNOSIS — K5730 Diverticulosis of large intestine without perforation or abscess without bleeding: Secondary | ICD-10-CM | POA: Diagnosis not present

## 2021-07-14 DIAGNOSIS — K635 Polyp of colon: Secondary | ICD-10-CM | POA: Diagnosis not present

## 2021-07-14 DIAGNOSIS — D123 Benign neoplasm of transverse colon: Secondary | ICD-10-CM | POA: Diagnosis not present

## 2021-07-14 DIAGNOSIS — Z86010 Personal history of colonic polyps: Secondary | ICD-10-CM | POA: Diagnosis not present

## 2021-07-14 DIAGNOSIS — Z1211 Encounter for screening for malignant neoplasm of colon: Secondary | ICD-10-CM | POA: Diagnosis not present

## 2021-07-14 DIAGNOSIS — D122 Benign neoplasm of ascending colon: Secondary | ICD-10-CM | POA: Diagnosis not present

## 2021-09-17 DIAGNOSIS — R0789 Other chest pain: Secondary | ICD-10-CM | POA: Diagnosis not present

## 2021-09-17 DIAGNOSIS — R079 Chest pain, unspecified: Secondary | ICD-10-CM | POA: Diagnosis not present

## 2021-10-11 DIAGNOSIS — J0140 Acute pansinusitis, unspecified: Secondary | ICD-10-CM | POA: Diagnosis not present

## 2021-10-11 DIAGNOSIS — H8303 Labyrinthitis, bilateral: Secondary | ICD-10-CM | POA: Diagnosis not present

## 2021-11-09 DIAGNOSIS — R0789 Other chest pain: Secondary | ICD-10-CM | POA: Diagnosis not present

## 2021-11-15 DIAGNOSIS — R0789 Other chest pain: Secondary | ICD-10-CM | POA: Diagnosis not present

## 2021-11-25 DIAGNOSIS — E7800 Pure hypercholesterolemia, unspecified: Secondary | ICD-10-CM | POA: Diagnosis not present

## 2021-11-25 DIAGNOSIS — M940 Chondrocostal junction syndrome [Tietze]: Secondary | ICD-10-CM | POA: Diagnosis not present

## 2021-11-25 DIAGNOSIS — R7303 Prediabetes: Secondary | ICD-10-CM | POA: Diagnosis not present

## 2021-11-25 DIAGNOSIS — M25472 Effusion, left ankle: Secondary | ICD-10-CM | POA: Diagnosis not present

## 2021-11-25 DIAGNOSIS — Z1331 Encounter for screening for depression: Secondary | ICD-10-CM | POA: Diagnosis not present

## 2021-11-25 DIAGNOSIS — Z23 Encounter for immunization: Secondary | ICD-10-CM | POA: Diagnosis not present

## 2021-11-25 DIAGNOSIS — Z1211 Encounter for screening for malignant neoplasm of colon: Secondary | ICD-10-CM | POA: Diagnosis not present

## 2021-11-25 DIAGNOSIS — Z0000 Encounter for general adult medical examination without abnormal findings: Secondary | ICD-10-CM | POA: Diagnosis not present

## 2021-11-25 DIAGNOSIS — I10 Essential (primary) hypertension: Secondary | ICD-10-CM | POA: Diagnosis not present

## 2021-11-29 DIAGNOSIS — Z01419 Encounter for gynecological examination (general) (routine) without abnormal findings: Secondary | ICD-10-CM | POA: Diagnosis not present

## 2021-11-29 DIAGNOSIS — Z1231 Encounter for screening mammogram for malignant neoplasm of breast: Secondary | ICD-10-CM | POA: Diagnosis not present

## 2021-11-29 DIAGNOSIS — Z6829 Body mass index (BMI) 29.0-29.9, adult: Secondary | ICD-10-CM | POA: Diagnosis not present

## 2021-11-29 DIAGNOSIS — Z13820 Encounter for screening for osteoporosis: Secondary | ICD-10-CM | POA: Diagnosis not present

## 2021-11-29 DIAGNOSIS — Z124 Encounter for screening for malignant neoplasm of cervix: Secondary | ICD-10-CM | POA: Diagnosis not present

## 2021-11-29 DIAGNOSIS — Z1239 Encounter for other screening for malignant neoplasm of breast: Secondary | ICD-10-CM | POA: Diagnosis not present

## 2021-12-03 ENCOUNTER — Other Ambulatory Visit: Payer: Self-pay | Admitting: Obstetrics and Gynecology

## 2021-12-03 DIAGNOSIS — Z13820 Encounter for screening for osteoporosis: Secondary | ICD-10-CM

## 2022-09-06 DIAGNOSIS — H35033 Hypertensive retinopathy, bilateral: Secondary | ICD-10-CM | POA: Diagnosis not present

## 2022-10-06 DIAGNOSIS — M7662 Achilles tendinitis, left leg: Secondary | ICD-10-CM | POA: Diagnosis not present

## 2022-10-06 DIAGNOSIS — R0789 Other chest pain: Secondary | ICD-10-CM | POA: Diagnosis not present

## 2022-10-06 DIAGNOSIS — R7303 Prediabetes: Secondary | ICD-10-CM | POA: Diagnosis not present

## 2022-10-06 DIAGNOSIS — M25559 Pain in unspecified hip: Secondary | ICD-10-CM | POA: Diagnosis not present

## 2022-10-06 DIAGNOSIS — M25519 Pain in unspecified shoulder: Secondary | ICD-10-CM | POA: Diagnosis not present

## 2022-10-06 DIAGNOSIS — E7800 Pure hypercholesterolemia, unspecified: Secondary | ICD-10-CM | POA: Diagnosis not present

## 2022-10-06 DIAGNOSIS — I10 Essential (primary) hypertension: Secondary | ICD-10-CM | POA: Diagnosis not present

## 2022-12-01 DIAGNOSIS — Z1239 Encounter for other screening for malignant neoplasm of breast: Secondary | ICD-10-CM | POA: Diagnosis not present

## 2022-12-01 DIAGNOSIS — Z1231 Encounter for screening mammogram for malignant neoplasm of breast: Secondary | ICD-10-CM | POA: Diagnosis not present

## 2022-12-01 DIAGNOSIS — Z13820 Encounter for screening for osteoporosis: Secondary | ICD-10-CM | POA: Diagnosis not present

## 2022-12-01 DIAGNOSIS — Z01419 Encounter for gynecological examination (general) (routine) without abnormal findings: Secondary | ICD-10-CM | POA: Diagnosis not present

## 2022-12-02 ENCOUNTER — Other Ambulatory Visit: Payer: Self-pay | Admitting: Obstetrics and Gynecology

## 2022-12-02 DIAGNOSIS — Z13820 Encounter for screening for osteoporosis: Secondary | ICD-10-CM

## 2022-12-27 ENCOUNTER — Ambulatory Visit: Payer: Self-pay | Admitting: Cardiovascular Disease

## 2023-01-06 DIAGNOSIS — M25559 Pain in unspecified hip: Secondary | ICD-10-CM | POA: Diagnosis not present

## 2023-01-06 DIAGNOSIS — N3001 Acute cystitis with hematuria: Secondary | ICD-10-CM | POA: Diagnosis not present

## 2023-01-06 DIAGNOSIS — R7303 Prediabetes: Secondary | ICD-10-CM | POA: Diagnosis not present

## 2023-01-06 DIAGNOSIS — R0981 Nasal congestion: Secondary | ICD-10-CM | POA: Diagnosis not present

## 2023-01-06 DIAGNOSIS — E7800 Pure hypercholesterolemia, unspecified: Secondary | ICD-10-CM | POA: Diagnosis not present

## 2023-01-06 DIAGNOSIS — I10 Essential (primary) hypertension: Secondary | ICD-10-CM | POA: Diagnosis not present

## 2023-01-06 DIAGNOSIS — Z1331 Encounter for screening for depression: Secondary | ICD-10-CM | POA: Diagnosis not present

## 2023-01-06 DIAGNOSIS — M940 Chondrocostal junction syndrome [Tietze]: Secondary | ICD-10-CM | POA: Diagnosis not present

## 2023-01-06 DIAGNOSIS — Z0000 Encounter for general adult medical examination without abnormal findings: Secondary | ICD-10-CM | POA: Diagnosis not present

## 2023-01-16 ENCOUNTER — Ambulatory Visit: Payer: Medicare PPO | Attending: Cardiovascular Disease | Admitting: Cardiovascular Disease

## 2023-01-16 ENCOUNTER — Encounter: Payer: Self-pay | Admitting: Cardiovascular Disease

## 2023-01-16 VITALS — BP 122/62 | HR 58 | Ht 65.0 in | Wt 170.0 lb

## 2023-01-16 DIAGNOSIS — E782 Mixed hyperlipidemia: Secondary | ICD-10-CM | POA: Diagnosis not present

## 2023-01-16 DIAGNOSIS — I10 Essential (primary) hypertension: Secondary | ICD-10-CM

## 2023-01-16 DIAGNOSIS — R0789 Other chest pain: Secondary | ICD-10-CM

## 2023-01-16 DIAGNOSIS — E785 Hyperlipidemia, unspecified: Secondary | ICD-10-CM | POA: Insufficient documentation

## 2023-01-16 NOTE — Patient Instructions (Signed)
Medication Instructions:  Your physician recommends that you continue on your current medications as directed. Please refer to the Current Medication list given to you today.  *If you need a refill on your cardiac medications before your next appointment, please call your pharmacy*   Testing/Procedures: Dr. Berry has ordered a CT coronary calcium score.   Test locations:  [facility]  [facility]  This is $99 out of pocket.   Coronary CalciumScan A coronary calcium scan is an imaging test used to look for deposits of calcium and other fatty materials (plaques) in the inner lining of the blood vessels of the heart (coronary arteries). These deposits of calcium and plaques can partly clog and narrow the coronary arteries without producing any symptoms or warning signs. This puts a person at risk for a heart attack. This test can detect these deposits before symptoms develop. Tell a health care provider about: Any allergies you have. All medicines you are taking, including vitamins, herbs, eye drops, creams, and over-the-counter medicines. Any problems you or family members have had with anesthetic medicines. Any blood disorders you have. Any surgeries you have had. Any medical conditions you have. Whether you are pregnant or may be pregnant. What are the risks? Generally, this is a safe procedure. However, problems may occur, including: Harm to a pregnant woman and her unborn baby. This test involves the use of radiation. Radiation exposure can be dangerous to a pregnant woman and her unborn baby. If you are pregnant, you generally should not have this procedure done. Slight increase in the risk of cancer. This is because of the radiation involved in the test. What happens before the procedure? No preparation is needed for this procedure. What happens during the procedure? You will undress and  remove any jewelry around your neck or chest. You will put on a hospital gown. Sticky electrodes will be placed on your chest. The electrodes will be connected to an electrocardiogram (ECG) machine to record a tracing of the electrical activity of your heart. A CT scanner will take pictures of your heart. During this time, you will be asked to lie still and hold your breath for 2-3 seconds while a picture of your heart is being taken. The procedure may vary among health care providers and hospitals. What happens after the procedure? You can get dressed. You can return to your normal activities. It is up to you to get the results of your test. Ask your health care provider, or the department that is doing the test, when your results will be ready. Summary A coronary calcium scan is an imaging test used to look for deposits of calcium and other fatty materials (plaques) in the inner lining of the blood vessels of the heart (coronary arteries). Generally, this is a safe procedure. Tell your health care provider if you are pregnant or may be pregnant. No preparation is needed for this procedure. A CT scanner will take pictures of your heart. You can return to your normal activities after the scan is done. This information is not intended to replace advice given to you by your health care provider. Make sure you discuss any questions you have with your health care provider. Document Released: 07/16/2007 Document Revised: 12/07/2015 Document Reviewed: 12/07/2015 Elsevier Interactive Patient Education  2017 Elsevier Inc.    Follow-Up: At [facility], you and your health needs are our priority.  As part of our continuing   mission to provide you with exceptional heart care, we have created designated Provider Care Teams.  These Care Teams include your primary Cardiologist (physician) and Advanced Practice Providers (APPs -  Physician Assistants and Nurse Practitioners) who all work together to  provide you with the care you need, when you need it.  We recommend signing up for the patient portal called "MyChart".  Sign up information is provided on this After Visit Summary.  MyChart is used to connect with patients for Virtual Visits (Telemedicine).  Patients are able to view lab/test results, encounter notes, upcoming appointments, etc.  Non-urgent messages can be sent to your provider as well.   To learn more about what you can do with MyChart, go to [email or web address].    Your next appointment:   We will see you on an as needed basis.  Provider:   Jonathan Berry, MD  

## 2023-01-16 NOTE — Assessment & Plan Note (Signed)
History of atypical chest pain which she has had for years.  She apparently saw Dr. Jacinto Halim remotely who did a stress test that was unremarkable.  The pain lasts all day and is unaffected by activity.  I do not think it is ischemically mediated.  I am going to get a coronary calcium score to her stratify.

## 2023-01-16 NOTE — Assessment & Plan Note (Signed)
Essential hypertension blood pressure measured today at 122/62.  She is on Diovan/hydrochlorothiazide.

## 2023-01-16 NOTE — Progress Notes (Signed)
01/16/2023 Harpreet Nur   1955/08/26  [id number]  Primary Physician Tally Joe, MD Primary Cardiologist: Runell Gess MD Nicholes Calamity, [place]  HPI:  Amber Farmer is a 67 y.o. 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  She was referred by her PCP, Dr. Azucena Cecil , for atypical chest pain.  Her risk factors include treated hypertension and untreated anemia.  Her father did die of a myocardial infarction in his 22s.  She is never had a heart attack or stroke.  She is evaluated by Dr. Jacinto Halim 10 to 15 years ago with a negative stress test.  She is fairly active and walks 3-4 times a week.  She has had chest pain which is fairly constant in nature which it does not sound ischemic.   Current Meds  Medication Sig   aspirin EC 81 MG tablet Take 81 mg by mouth daily.   Calcium 200 MG TABS Take by mouth.   Multiple Vitamin (MULTIVITAMIN) capsule Take 1 capsule by mouth daily.   neomycin-polymyxin-hydrocortisone (CORTISPORIN) OTIC solution 2-3 drops once at day to both big toes for 2 weeks for pain around toenail   valsartan-hydrochlorothiazide (DIOVAN-HCT) 160-12.5 MG tablet Take 1 tablet by mouth daily.   VITAMIN D PO Take 1 tablet by mouth daily.     Allergies  Allergen Reactions   Codeine Camsylate [Codeine] Itching    Social History   Socioeconomic History   Marital status: Married    Spouse name: Not on file   Number of children: Not on file   Years of education: Not on file   Highest education level: Not on file  Occupational History   Not on file  Tobacco Use   Smoking status: Never   Smokeless tobacco: Never  Vaping Use   Vaping status: Never Used  Substance and Sexual Activity   Alcohol use: Not on file   Drug use: Not on file   Sexual activity: Not on file  Other Topics Concern   Not on file   Social History Narrative   Not on file   Social Drivers of Health   Financial Resource Strain: Not on file  Food Insecurity: Not on file  Transportation Needs: Not on file  Physical Activity: Not on file  Stress: Not on file  Social Connections: Not on file  Intimate Partner Violence: Not on file     Review of Systems: General: negative for chills, fever, night sweats or weight changes.  Cardiovascular: negative for chest pain, dyspnea on exertion, edema, orthopnea, palpitations, paroxysmal nocturnal dyspnea or shortness of breath Dermatological: negative for rash Respiratory: negative for cough or wheezing Urologic: negative for hematuria Abdominal: negative for nausea, vomiting, diarrhea, bright red blood per rectum, melena, or hematemesis Neurologic: negative for visual changes, syncope, or dizziness All other systems reviewed and are otherwise negative except as noted above.    Blood pressure 122/62, pulse (!) 58, height 5\' 5"  (1.651 m), weight 170 lb (77.1 kg).  General appearance: alert and no distress Neck: no adenopathy, no carotid bruit, no JVD, supple, symmetrical, trachea midline, and thyroid not enlarged, symmetric, no tenderness/mass/nodules Lungs: clear to auscultation bilaterally Heart: regular rate and rhythm, S1, S2 normal, no murmur, click, rub or gallop Extremities: extremities normal, atraumatic, no cyanosis or edema Pulses: 2+ and symmetric Skin: Skin color, texture, turgor normal. No rashes or lesions  Neurologic: Grossly normal  EKG EKG Interpretation Date/Time:  Monday January 16 2023 10:53:45 EST Ventricular Rate:  58 PR Interval:  140 QRS Duration:  84 QT Interval:  324 QTC Calculation: 318 R Axis:   42  Text Interpretation: Sinus bradycardia with sinus arrhythmia Nonspecific T wave abnormality No previous ECGs available Confirmed by Nanetta Batty [phone]) on 01/16/2023 10:57:28 AM    ASSESSMENT AND PLAN:   Atypical chest pain History of  atypical chest pain which she has had for years.  She apparently saw Dr. Jacinto Halim remotely who did a stress test that was unremarkable.  The pain lasts all day and is unaffected by activity.  I do not think it is ischemically mediated.  I am going to get a coronary calcium score to her stratify.  Hyperlipidemia History of hyperlipidemia not on statin therapy with lipid profile performed by her PCP 10/06/2022 revealing a total cholesterol 215, LDL of 114 and HDL 40.  Her primary care physician is managing this.  Essential hypertension Essential hypertension blood pressure measured today at 122/62.  She is on Diovan/hydrochlorothiazide.     Runell Gess MD FACP,FACC,FAHA, [facility] 01/16/2023 11:11 AM

## 2023-01-16 NOTE — Assessment & Plan Note (Signed)
History of hyperlipidemia not on statin therapy with lipid profile performed by her PCP 10/06/2022 revealing a total cholesterol 215, LDL of 114 and HDL 40.  Her primary care physician is managing this.

## 2023-01-27 NOTE — Addendum Note (Signed)
Addended by: Bernita Buffy on: 01/27/2023 09:00 AM   Modules accepted: Orders

## 2023-03-06 DIAGNOSIS — R0982 Postnasal drip: Secondary | ICD-10-CM | POA: Diagnosis not present

## 2023-03-06 DIAGNOSIS — Z23 Encounter for immunization: Secondary | ICD-10-CM | POA: Diagnosis not present

## 2023-03-06 DIAGNOSIS — Z6826 Body mass index (BMI) 26.0-26.9, adult: Secondary | ICD-10-CM | POA: Diagnosis not present

## 2023-03-06 DIAGNOSIS — Z20822 Contact with and (suspected) exposure to covid-19: Secondary | ICD-10-CM | POA: Diagnosis not present

## 2023-04-10 DIAGNOSIS — E7800 Pure hypercholesterolemia, unspecified: Secondary | ICD-10-CM | POA: Diagnosis not present

## 2023-04-12 DIAGNOSIS — R0982 Postnasal drip: Secondary | ICD-10-CM | POA: Diagnosis not present

## 2023-04-12 DIAGNOSIS — I10 Essential (primary) hypertension: Secondary | ICD-10-CM | POA: Diagnosis not present

## 2023-04-12 DIAGNOSIS — R7303 Prediabetes: Secondary | ICD-10-CM | POA: Diagnosis not present

## 2023-04-12 DIAGNOSIS — R0989 Other specified symptoms and signs involving the circulatory and respiratory systems: Secondary | ICD-10-CM | POA: Diagnosis not present

## 2023-04-12 DIAGNOSIS — R0789 Other chest pain: Secondary | ICD-10-CM | POA: Diagnosis not present

## 2023-04-12 DIAGNOSIS — R5383 Other fatigue: Secondary | ICD-10-CM | POA: Diagnosis not present

## 2023-04-12 DIAGNOSIS — R053 Chronic cough: Secondary | ICD-10-CM | POA: Diagnosis not present

## 2023-04-18 ENCOUNTER — Other Ambulatory Visit: Payer: Self-pay | Admitting: Family Medicine

## 2023-04-18 DIAGNOSIS — R053 Chronic cough: Secondary | ICD-10-CM

## 2023-04-18 DIAGNOSIS — R9389 Abnormal findings on diagnostic imaging of other specified body structures: Secondary | ICD-10-CM

## 2023-04-30 DIAGNOSIS — S93601A Unspecified sprain of right foot, initial encounter: Secondary | ICD-10-CM | POA: Diagnosis not present

## 2023-04-30 DIAGNOSIS — M79671 Pain in right foot: Secondary | ICD-10-CM | POA: Diagnosis not present

## 2023-05-22 DIAGNOSIS — R09A2 Foreign body sensation, throat: Secondary | ICD-10-CM | POA: Diagnosis not present

## 2023-05-22 DIAGNOSIS — J309 Allergic rhinitis, unspecified: Secondary | ICD-10-CM | POA: Diagnosis not present

## 2023-05-22 DIAGNOSIS — R0982 Postnasal drip: Secondary | ICD-10-CM | POA: Diagnosis not present

## 2023-05-23 ENCOUNTER — Ambulatory Visit
Admission: RE | Admit: 2023-05-23 | Discharge: 2023-05-23 | Disposition: A | Discharge status: Home / Self Care | Source: Ambulatory Visit | Attending: Family Medicine | Admitting: Family Medicine

## 2023-05-23 DIAGNOSIS — R9389 Abnormal findings on diagnostic imaging of other specified body structures: Secondary | ICD-10-CM

## 2023-05-23 DIAGNOSIS — R918 Other nonspecific abnormal finding of lung field: Secondary | ICD-10-CM | POA: Diagnosis not present

## 2023-05-23 DIAGNOSIS — R053 Chronic cough: Secondary | ICD-10-CM

## 2023-06-05 DIAGNOSIS — J181 Lobar pneumonia, unspecified organism: Secondary | ICD-10-CM | POA: Diagnosis not present

## 2023-06-05 DIAGNOSIS — I2721 Secondary pulmonary arterial hypertension: Secondary | ICD-10-CM | POA: Diagnosis not present

## 2023-08-01 ENCOUNTER — Other Ambulatory Visit (HOSPITAL_BASED_OUTPATIENT_CLINIC_OR_DEPARTMENT_OTHER)

## 2023-08-01 ENCOUNTER — Other Ambulatory Visit: Payer: Medicare PPO

## 2023-08-16 DIAGNOSIS — M2042 Other hammer toe(s) (acquired), left foot: Secondary | ICD-10-CM | POA: Diagnosis not present

## 2023-08-16 DIAGNOSIS — L603 Nail dystrophy: Secondary | ICD-10-CM | POA: Diagnosis not present

## 2023-08-16 DIAGNOSIS — M7662 Achilles tendinitis, left leg: Secondary | ICD-10-CM | POA: Diagnosis not present

## 2023-08-16 DIAGNOSIS — L600 Ingrowing nail: Secondary | ICD-10-CM | POA: Diagnosis not present

## 2023-08-16 DIAGNOSIS — M205X2 Other deformities of toe(s) (acquired), left foot: Secondary | ICD-10-CM | POA: Diagnosis not present

## 2023-08-16 DIAGNOSIS — M205X1 Other deformities of toe(s) (acquired), right foot: Secondary | ICD-10-CM | POA: Diagnosis not present

## 2023-08-16 DIAGNOSIS — B351 Tinea unguium: Secondary | ICD-10-CM | POA: Diagnosis not present

## 2023-08-16 DIAGNOSIS — M2041 Other hammer toe(s) (acquired), right foot: Secondary | ICD-10-CM | POA: Diagnosis not present

## 2023-08-16 DIAGNOSIS — M2012 Hallux valgus (acquired), left foot: Secondary | ICD-10-CM | POA: Diagnosis not present

## 2023-08-16 DIAGNOSIS — M2011 Hallux valgus (acquired), right foot: Secondary | ICD-10-CM | POA: Diagnosis not present

## 2023-08-18 ENCOUNTER — Encounter: Payer: Self-pay | Admitting: Advanced Practice Midwife

## 2023-08-30 DIAGNOSIS — M205X2 Other deformities of toe(s) (acquired), left foot: Secondary | ICD-10-CM | POA: Diagnosis not present

## 2023-08-30 DIAGNOSIS — B351 Tinea unguium: Secondary | ICD-10-CM | POA: Diagnosis not present

## 2023-08-30 DIAGNOSIS — L600 Ingrowing nail: Secondary | ICD-10-CM | POA: Diagnosis not present

## 2023-08-30 DIAGNOSIS — M7662 Achilles tendinitis, left leg: Secondary | ICD-10-CM | POA: Diagnosis not present

## 2023-08-30 DIAGNOSIS — M2012 Hallux valgus (acquired), left foot: Secondary | ICD-10-CM | POA: Diagnosis not present

## 2023-08-30 DIAGNOSIS — M2041 Other hammer toe(s) (acquired), right foot: Secondary | ICD-10-CM | POA: Diagnosis not present

## 2023-08-30 DIAGNOSIS — M205X1 Other deformities of toe(s) (acquired), right foot: Secondary | ICD-10-CM | POA: Diagnosis not present

## 2023-08-30 DIAGNOSIS — M2011 Hallux valgus (acquired), right foot: Secondary | ICD-10-CM | POA: Diagnosis not present

## 2023-08-30 DIAGNOSIS — M2042 Other hammer toe(s) (acquired), left foot: Secondary | ICD-10-CM | POA: Diagnosis not present

## 2023-08-31 DIAGNOSIS — M19011 Primary osteoarthritis, right shoulder: Secondary | ICD-10-CM | POA: Diagnosis not present

## 2023-09-04 DIAGNOSIS — L603 Nail dystrophy: Secondary | ICD-10-CM | POA: Diagnosis not present

## 2023-09-07 ENCOUNTER — Ambulatory Visit: Admitting: Internal Medicine

## 2023-09-07 ENCOUNTER — Encounter: Payer: Self-pay | Admitting: Internal Medicine

## 2023-09-07 VITALS — BP 140/82 | HR 67 | Ht 65.0 in | Wt 170.0 lb

## 2023-09-07 DIAGNOSIS — R911 Solitary pulmonary nodule: Secondary | ICD-10-CM

## 2023-09-07 DIAGNOSIS — R079 Chest pain, unspecified: Secondary | ICD-10-CM

## 2023-09-07 DIAGNOSIS — G8929 Other chronic pain: Secondary | ICD-10-CM

## 2023-09-07 NOTE — Progress Notes (Signed)
 Amber Farmer    [id number]    01/11/1956  Primary Care Physician:Swayne, Alm, MD  Referring Physician: Seabron Alm, MD [phone] [address] [place],  [place] [address] Reason for Consultation: CT Chest Date of Consultation: 09/07/2023  Chief complaint:   Chief Complaint  Patient presents with   Consult    Ct chest follow-up     HPI: Discussed the use of AI scribe software for clinical note transcription with the patient, who gave verbal consent to proceed.  History of Present Illness Amber Farmer is a 68 year old female who presents with chronic chest pain for a pulmonary consultation following a CT scan. She is accompanied by her husband and a friend. She was referred by Dr. Ressie office for evaluation of chest pain after a CT scan showed abnormalities.  She has experienced chronic chest pain for approximately 40 years. The pain is constant, sometimes more intense, and is located retrosternally. It is described as sharp and is not triggered by activity or exertion. It does not disturb her sleep and can occur multiple times a day, lasting from a second to a minute and a half.  In April, a CT scan was ordered due to her persistent chest pain. She has had multiple evaluations, including seeing Dr. Work in 2020 and undergoing various tests. 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  Her family history includes her mother having brain cancer that possibly metastasized to the lungs, and her father having a history of stroke and myocardial infarction. She is concerned about her father's cardiac history. She also mentions her brother possibly having a transient ischemic attack.  She denies any history of smoking, recurrent bronchitis, pneumonia, or asthma. She ran track in school and was able to keep up with her peers. She has hypertension but no other chronic health issues. She  has had knee surgery at 17 and a tumor removed, but no surgeries related to her chest pain.  She has experienced swelling in her left leg for about 20 years, which she describes as losing the architecture of her ankles. She has had ultrasounds and MRIs of her leg due to her father's history, but no definitive cause has been found.  Social history:  Occupation: retired Runner, broadcasting/film/video Exposures: lives with husband, no pets.  Smoking history: never smoker  Social History   Occupational History   Not on file  Tobacco Use   Smoking status: Never   Smokeless tobacco: Never  Vaping Use   Vaping status: Never Used  Substance and Sexual Activity   Alcohol use: Not on file   Drug use: Not on file   Sexual activity: Not on file    Relevant family history:  Family History  Problem Relation Age of Onset   Brain cancer Mother    Stroke Father    Heart attack Father    Stroke Brother    Breast cancer Maternal Grandmother        ? age of onset    Past Medical History:  Diagnosis Date   Hypertension     Past Surgical History:  Procedure Laterality Date   KNEE ARTHROPLASTY     TUBAL LIGATION       Physical Exam: Blood pressure (!) 140/82, pulse 67, height 5' 5 (1.651 m), weight 170 lb (77.1 kg), SpO2 97%. Gen:      No acute distress ENT:  no nasal polyps, mucus membranes moist Lungs:    No increased respiratory effort, symmetric chest wall excursion, clear to auscultation bilaterally, no wheezes or crackles CV:         Regular rate and rhythm; no murmurs, rubs, or gallops.  LLE chronic swelling for 20 years, no pain or tenderness Abd:      + bowel sounds; soft, non-tender; no distension MSK: no acute synovitis of DIP or PIP joints, no mechanics hands.  Skin:      Warm and dry; no rashes Neuro: normal speech, no focal facial asymmetry Psych: alert and oriented x3, normal mood and affect   Data Reviewed/Medical Decision Making:  Independent interpretation of tests: Imaging:   Review of patient's CT Chest images April 2025 revealed RUL wedge shaped opacity, . The patient's images have been independently reviewed by me.    PFTs:   Labs: No results found for: WBC, HGB, HCT, MCV, PLT No results found for: NA, K, CO2, GLUCOSE, BUN, CREATININE, CALCIUM, GFR, EGFR, GFRNONAA   Immunization status:  Immunization History  Administered Date(s) Administered   PFIZER(Purple Top)SARS-COV-2 Vaccination 04/06/2019, 04/27/2019, 02/04/2020     I reviewed prior external note(s) from pulmonary, cardiology  I reviewed the result(s) of the labs and imaging as noted above.   I have ordered ct chest  Assessment and Plan Assessment & Plan Right upper lobe pulmonary nodule, under evaluation CT scan showed a wedge-shaped opacity in the right upper lobe, likely incidental. Differential includes mucus, collapsed lung segment, infection, or infarct. No signs of embolism or malignancy. Pulmonary hypertension unlikely. - Order repeat CT scan of the chest to assess resolution of the right upper lobe abnormality. - Review repeat CT scan results and determine further steps if necessary, such as watchful waiting or biopsy if the abnormality persists.  Chronic left-sided chest pain, etiology unclear Chronic left-sided chest pain for 40 years, constant, sharp, variable intensity, not exertion-related. Extensive workup inconclusive. Unrelated to the right upper lobe nodule. She opted to stop further diagnostic workup.  I spent 45 minutes in total visit time for this patient, with more than 50% spent counseling/coordinating care.   Return to Care: After CT Chest  Verdon Gore, MD Pulmonary and Critical Care Medicine [phone]  CC: Seabron Lenis, MD

## 2023-09-07 NOTE — Patient Instructions (Signed)
 It was a pleasure to see you today!  Before your next visit I would like you to have: CT Chest  VISIT SUMMARY:  Today, you visited for a pulmonary consultation due to your chronic chest pain and a recent CT scan showing abnormalities. We discussed your long history of chest pain and reviewed the findings from your recent imaging.  YOUR PLAN:  -RIGHT UPPER LOBE PULMONARY NODULE: A nodule was found in the right upper part of your lung, which could be due to various reasons such as mucus, a collapsed lung segment, infection, or infarct. There are no signs of a blood clot or cancer. We will repeat the CT scan to see if the nodule resolves on its own. Depending on the results, we may either continue to monitor it or consider a biopsy if it persists.  -CHRONIC CHEST PAIN: You have been experiencing constant, sharp chest pain on the left side for 40 years. Despite extensive testing, the cause remains unclear and is not related to the nodule in your lung.   INSTRUCTIONS:  Please schedule a follow-up appointment for a repeat CT scan of your chest. We will review the results to determine the next steps. If you have any new symptoms or concerns, contact our office.

## 2023-09-19 ENCOUNTER — Ambulatory Visit (HOSPITAL_COMMUNITY)
Admission: RE | Admit: 2023-09-19 | Discharge: 2023-09-19 | Disposition: A | Discharge status: Home / Self Care | Source: Ambulatory Visit | Attending: Internal Medicine | Admitting: Internal Medicine

## 2023-09-19 DIAGNOSIS — R911 Solitary pulmonary nodule: Secondary | ICD-10-CM | POA: Diagnosis not present

## 2023-09-19 DIAGNOSIS — R918 Other nonspecific abnormal finding of lung field: Secondary | ICD-10-CM | POA: Diagnosis not present

## 2023-09-27 DIAGNOSIS — M2041 Other hammer toe(s) (acquired), right foot: Secondary | ICD-10-CM | POA: Diagnosis not present

## 2023-09-27 DIAGNOSIS — B351 Tinea unguium: Secondary | ICD-10-CM | POA: Diagnosis not present

## 2023-09-27 DIAGNOSIS — M7662 Achilles tendinitis, left leg: Secondary | ICD-10-CM | POA: Diagnosis not present

## 2023-09-27 DIAGNOSIS — M205X2 Other deformities of toe(s) (acquired), left foot: Secondary | ICD-10-CM | POA: Diagnosis not present

## 2023-09-27 DIAGNOSIS — M2011 Hallux valgus (acquired), right foot: Secondary | ICD-10-CM | POA: Diagnosis not present

## 2023-09-27 DIAGNOSIS — M205X1 Other deformities of toe(s) (acquired), right foot: Secondary | ICD-10-CM | POA: Diagnosis not present

## 2023-09-27 DIAGNOSIS — M2012 Hallux valgus (acquired), left foot: Secondary | ICD-10-CM | POA: Diagnosis not present

## 2023-09-27 DIAGNOSIS — L600 Ingrowing nail: Secondary | ICD-10-CM | POA: Diagnosis not present

## 2023-09-27 DIAGNOSIS — M2042 Other hammer toe(s) (acquired), left foot: Secondary | ICD-10-CM | POA: Diagnosis not present

## 2023-10-11 ENCOUNTER — Encounter: Payer: Self-pay | Admitting: Internal Medicine

## 2023-10-11 ENCOUNTER — Ambulatory Visit: Payer: Self-pay | Admitting: Internal Medicine

## 2023-10-11 DIAGNOSIS — R911 Solitary pulmonary nodule: Secondary | ICD-10-CM

## 2023-12-12 DIAGNOSIS — M2040 Other hammer toe(s) (acquired), unspecified foot: Secondary | ICD-10-CM | POA: Diagnosis not present

## 2023-12-12 DIAGNOSIS — M2010 Hallux valgus (acquired), unspecified foot: Secondary | ICD-10-CM | POA: Diagnosis not present

## 2023-12-12 DIAGNOSIS — E785 Hyperlipidemia, unspecified: Secondary | ICD-10-CM | POA: Diagnosis not present

## 2023-12-12 DIAGNOSIS — Z9181 History of falling: Secondary | ICD-10-CM | POA: Diagnosis not present

## 2023-12-12 DIAGNOSIS — Z8249 Family history of ischemic heart disease and other diseases of the circulatory system: Secondary | ICD-10-CM | POA: Diagnosis not present

## 2023-12-12 DIAGNOSIS — I10 Essential (primary) hypertension: Secondary | ICD-10-CM | POA: Diagnosis not present

## 2023-12-12 DIAGNOSIS — Z833 Family history of diabetes mellitus: Secondary | ICD-10-CM | POA: Diagnosis not present

## 2023-12-12 DIAGNOSIS — I272 Pulmonary hypertension, unspecified: Secondary | ICD-10-CM | POA: Diagnosis not present

## 2024-01-05 DIAGNOSIS — Z23 Encounter for immunization: Secondary | ICD-10-CM | POA: Diagnosis not present

## 2024-03-13 ENCOUNTER — Ambulatory Visit (HOSPITAL_BASED_OUTPATIENT_CLINIC_OR_DEPARTMENT_OTHER)
